# Patient Record
Sex: Female | Born: 1990 | Race: White | Hispanic: No | Smoking: Never smoker
Health system: Southern US, Community
[De-identification: ages and names within clinical notes are randomized; demographics above are authoritative.]

## PROBLEM LIST (undated history)

## (undated) DIAGNOSIS — Z8619 Personal history of other infectious and parasitic diseases: Secondary | ICD-10-CM

## (undated) HISTORY — PX: TONSILLECTOMY: SUR1361

## (undated) HISTORY — DX: Personal history of other infectious and parasitic diseases: Z86.19

## (undated) HISTORY — PX: OTHER SURGICAL HISTORY: SHX169

---
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
## 2013-12-03 DIAGNOSIS — M549 Dorsalgia, unspecified: Secondary | ICD-10-CM | POA: Insufficient documentation

## 2013-12-03 DIAGNOSIS — E663 Overweight: Secondary | ICD-10-CM | POA: Insufficient documentation

## 2015-12-27 DIAGNOSIS — M5384 Other specified dorsopathies, thoracic region: Secondary | ICD-10-CM | POA: Diagnosis not present

## 2015-12-27 DIAGNOSIS — M9902 Segmental and somatic dysfunction of thoracic region: Secondary | ICD-10-CM | POA: Diagnosis not present

## 2015-12-27 DIAGNOSIS — M9901 Segmental and somatic dysfunction of cervical region: Secondary | ICD-10-CM | POA: Diagnosis not present

## 2015-12-27 DIAGNOSIS — M62830 Muscle spasm of back: Secondary | ICD-10-CM | POA: Diagnosis not present

## 2015-12-27 DIAGNOSIS — M542 Cervicalgia: Secondary | ICD-10-CM | POA: Diagnosis not present

## 2015-12-31 DIAGNOSIS — M542 Cervicalgia: Secondary | ICD-10-CM | POA: Diagnosis not present

## 2015-12-31 DIAGNOSIS — M5384 Other specified dorsopathies, thoracic region: Secondary | ICD-10-CM | POA: Diagnosis not present

## 2015-12-31 DIAGNOSIS — M62830 Muscle spasm of back: Secondary | ICD-10-CM | POA: Diagnosis not present

## 2015-12-31 DIAGNOSIS — M9902 Segmental and somatic dysfunction of thoracic region: Secondary | ICD-10-CM | POA: Diagnosis not present

## 2015-12-31 DIAGNOSIS — M9901 Segmental and somatic dysfunction of cervical region: Secondary | ICD-10-CM | POA: Diagnosis not present

## 2016-01-03 DIAGNOSIS — M9901 Segmental and somatic dysfunction of cervical region: Secondary | ICD-10-CM | POA: Diagnosis not present

## 2016-01-03 DIAGNOSIS — M62830 Muscle spasm of back: Secondary | ICD-10-CM | POA: Diagnosis not present

## 2016-01-03 DIAGNOSIS — M9902 Segmental and somatic dysfunction of thoracic region: Secondary | ICD-10-CM | POA: Diagnosis not present

## 2016-01-03 DIAGNOSIS — M542 Cervicalgia: Secondary | ICD-10-CM | POA: Diagnosis not present

## 2016-01-03 DIAGNOSIS — M5384 Other specified dorsopathies, thoracic region: Secondary | ICD-10-CM | POA: Diagnosis not present

## 2016-01-08 DIAGNOSIS — M542 Cervicalgia: Secondary | ICD-10-CM | POA: Diagnosis not present

## 2016-01-08 DIAGNOSIS — M5384 Other specified dorsopathies, thoracic region: Secondary | ICD-10-CM | POA: Diagnosis not present

## 2016-01-08 DIAGNOSIS — M9901 Segmental and somatic dysfunction of cervical region: Secondary | ICD-10-CM | POA: Diagnosis not present

## 2016-01-08 DIAGNOSIS — M62830 Muscle spasm of back: Secondary | ICD-10-CM | POA: Diagnosis not present

## 2016-01-08 DIAGNOSIS — M9902 Segmental and somatic dysfunction of thoracic region: Secondary | ICD-10-CM | POA: Diagnosis not present

## 2016-01-15 DIAGNOSIS — M62830 Muscle spasm of back: Secondary | ICD-10-CM | POA: Diagnosis not present

## 2016-01-15 DIAGNOSIS — M5384 Other specified dorsopathies, thoracic region: Secondary | ICD-10-CM | POA: Diagnosis not present

## 2016-01-15 DIAGNOSIS — M9902 Segmental and somatic dysfunction of thoracic region: Secondary | ICD-10-CM | POA: Diagnosis not present

## 2016-01-15 DIAGNOSIS — M542 Cervicalgia: Secondary | ICD-10-CM | POA: Diagnosis not present

## 2016-01-15 DIAGNOSIS — M9901 Segmental and somatic dysfunction of cervical region: Secondary | ICD-10-CM | POA: Diagnosis not present

## 2016-05-09 ENCOUNTER — Encounter: Payer: Self-pay | Admitting: Advanced Practice Midwife

## 2016-05-09 ENCOUNTER — Ambulatory Visit (INDEPENDENT_AMBULATORY_CARE_PROVIDER_SITE_OTHER): Payer: 59 | Admitting: Advanced Practice Midwife

## 2016-05-09 VITALS — BP 118/81 | HR 88 | Ht 64.0 in | Wt 162.0 lb

## 2016-05-09 DIAGNOSIS — Z01419 Encounter for gynecological examination (general) (routine) without abnormal findings: Secondary | ICD-10-CM

## 2016-05-09 DIAGNOSIS — Z0000 Encounter for general adult medical examination without abnormal findings: Secondary | ICD-10-CM

## 2016-05-09 DIAGNOSIS — Z30011 Encounter for initial prescription of contraceptive pills: Secondary | ICD-10-CM | POA: Insufficient documentation

## 2016-05-09 MED ORDER — NORGESTIMATE-ETH ESTRADIOL 0.25-35 MG-MCG PO TABS: 1.0000 | ORAL_TABLET | Freq: Every day | ORAL | 13 refills | Status: DC

## 2016-05-09 NOTE — Patient Instructions (Signed)
Oral Contraception Use Oral contraceptive pills (OCPs) are medicines taken to prevent pregnancy. OCPs work by preventing the ovaries from releasing eggs. The hormones in OCPs also cause the cervical mucus to thicken, preventing the sperm from entering the uterus. The hormones also cause the uterine lining to become thin, not allowing a fertilized egg to attach to the inside of the uterus. OCPs are highly effective when taken exactly as prescribed. However, OCPs do not prevent sexually transmitted diseases (STDs). Safe sex practices, such as using condoms along with an OCP, can help prevent STDs. Before taking OCPs, you may have a physical exam and Pap test. Your health care provider may also order blood tests if necessary. Your health care provider will make sure you are a good candidate for oral contraception. Discuss with your health care provider the possible side effects of the OCP you may be prescribed. When starting an OCP, it can take 2 to 3 months for the body to adjust to the changes in hormone levels in your body. How to take oral contraceptive pills Your health care provider may advise you on how to start taking the first cycle of OCPs. Otherwise, you can:  Start on day 1 of your menstrual period. You will not need any backup contraceptive protection with this start time.  Start on the first Sunday after your menstrual period or the day you get your prescription. In these cases, you will need to use backup contraceptive protection for the first week.  Start the pill at any time of your cycle. If you take the pill within 5 days of the start of your period, you are protected against pregnancy right away. In this case, you will not need a backup form of birth control. If you start at any other time of your menstrual cycle, you will need to use another form of birth control for 7 days. If your OCP is the type called a minipill, it will protect you from pregnancy after taking it for 2 days (48  hours).  After you have started taking OCPs:  If you forget to take 1 pill, take it as soon as you remember. Take the next pill at the regular time.  If you miss 2 or more pills, call your health care provider because different pills have different instructions for missed doses. Use backup birth control until your next menstrual period starts.  If you use a 28-day pack that contains inactive pills and you miss 1 of the last 7 pills (pills with no hormones), it will not matter. Throw away the rest of the non-hormone pills and start a new pill pack.  No matter which day you start the OCP, you will always start a new pack on that same day of the week. Have an extra pack of OCPs and a backup contraceptive method available in case you miss some pills or lose your OCP pack. Follow these instructions at home:  Do not smoke.  Always use a condom to protect against STDs. OCPs do not protect against STDs.  Use a calendar to mark your menstrual period days.  Read the information and directions that came with your OCP. Talk to your health care provider if you have questions. Contact a health care provider if:  You develop nausea and vomiting.  You have abnormal vaginal discharge or bleeding.  You develop a rash.  You miss your menstrual period.  You are losing your hair.  You need treatment for mood swings or depression.  You   get dizzy when taking the OCP.  You develop acne from taking the OCP.  You become pregnant. Get help right away if:  You develop chest pain.  You develop shortness of breath.  You have an uncontrolled or severe headache.  You develop numbness or slurred speech.  You develop visual problems.  You develop pain, redness, and swelling in the legs. This information is not intended to replace advice given to you by your health care provider. Make sure you discuss any questions you have with your health care provider. Document Released: 02/27/2011 Document  Revised: 08/16/2015 Document Reviewed: 08/29/2012 Elsevier Interactive Patient Education  2017 Elsevier Inc.  

## 2016-05-09 NOTE — Progress Notes (Signed)
GYNECOLOGY ANNUAL PREVENTATIVE CARE ENCOUNTER NOTE  Subjective:   Rachel Reeves is a 26 y.o. G0P0000 female here for a routine annual gynecologic exam.  Current complaints: none.  Planning to get married in July.  Virginal.  Periods regular.  Denies abnormal vaginal bleeding, discharge, pelvic pain, problems with intercourse or other gynecologic concerns.    Gynecologic History Patient's last menstrual period was 05/05/2016. Contraception: none and but plans OCPs Last Pap: never.    Obstetric History OB History  Gravida Para Term Preterm AB Living  0 0 0 0 0 0  SAB TAB Ectopic Multiple Live Births  0 0 0 0 0        History reviewed. No pertinent past medical history.  Past Surgical History:  Procedure Laterality Date  . cataract surgery      No current outpatient prescriptions on file prior to visit.   No current facility-administered medications on file prior to visit.     Allergies  Allergen Reactions  . Monistat [Miconazole]     Social History   Social History  . Marital status: Single    Spouse name: N/A  . Number of children: N/A  . Years of education: N/A   Occupational History  . Not on file.   Social History Main Topics  . Smoking status: Never Smoker  . Smokeless tobacco: Never Used  . Alcohol use No  . Drug use: No  . Sexual activity: No   Other Topics Concern  . Not on file   Social History Narrative  . No narrative on file    Family History  Problem Relation Age of Onset  . Heart disease Paternal Grandmother   . Hypertension Paternal Grandmother   . Diabetes Paternal Grandmother     The following portions of the patient's history were reviewed and updated as appropriate: allergies, current medications, past family history, past medical history, past social history, past surgical history and problem list.  Review of Systems Pertinent items are noted in HPI.   Objective:  BP 118/81   Pulse 88   Ht 5\' 4"  (1.626 m)   Wt 162  lb (73.5 kg)   LMP 05/05/2016   BMI 27.81 kg/m  CONSTITUTIONAL: Well-developed, well-nourished female in no acute distress.  HENT:  Normocephalic, atraumatic, External right and left ear normal. Oropharynx is clear and moist EYES: Conjunctivae and EOM are normal. Pupils are equal, round, and reactive to light. No scleral icterus.  NECK: Normal range of motion, supple, no masses.  Normal thyroid.  SKIN: Skin is warm and dry. No rash noted. Not diaphoretic. No erythema. No pallor. NEUROLOGIC: Alert and oriented to person, place, and time. Normal reflexes, muscle tone coordination. No cranial nerve deficit noted. PSYCHIATRIC: Normal mood and affect. Normal behavior. Normal judgment and thought content. CARDIOVASCULAR: Normal heart rate noted, regular rhythm RESPIRATORY: Clear to auscultation bilaterally. Effort and breath sounds normal, no problems with respiration noted. BREASTS: Symmetric in size. No masses, skin changes, nipple drainage, or lymphadenopathy. ABDOMEN: Soft, normal bowel sounds, no distention noted.  No tenderness, rebound or guarding.  PELVIC: Normal appearing external genitalia; normal appearing vaginal mucosa and cervix.  No abnormal discharge noted.  Pap smear obtained.  Normal uterine size, no other palpable masses, no uterine or adnexal tenderness.  Tender on exam. Tight hymenal ring.  Cervix tiny, difficult to fully visualize due to discomfort. Pap done but unsure if TZ obtained. MUSCULOSKELETAL: Normal range of motion. No tenderness.  No cyanosis, clubbing, or edema.  2+  distal pulses.   Assessment:  Annual gynecologic examination with pap smear Contraception initiation   Plan:  Will follow up results of pap smear and manage accordingly. Rx Sprintec OCPs.  Discussed continuous use and risk of BTB in 3 month cycle.  Recommend take monthly at first then try a 634-month cycle.  Wants to try to time around honeymoon.  BP check in one month.  OK to do at work (works [employer]) Routine preventative health maintenance measures emphasized. Please refer to After Visit Summary for other counseling recommendations.

## 2016-05-12 LAB — CYTOLOGY - PAP: Diagnosis: NEGATIVE

## 2016-08-24 ENCOUNTER — Encounter: Payer: Self-pay | Admitting: Emergency Medicine

## 2016-08-24 ENCOUNTER — Ambulatory Visit (HOSPITAL_BASED_OUTPATIENT_CLINIC_OR_DEPARTMENT_OTHER)
Admission: RE | Admit: 2016-08-24 | Discharge: 2016-08-24 | Disposition: A | Discharge status: Home / Self Care | Payer: 59 | Source: Ambulatory Visit | Attending: Emergency Medicine | Admitting: Emergency Medicine

## 2016-08-24 ENCOUNTER — Emergency Department
Admission: EM | Admit: 2016-08-24 | Discharge: 2016-08-24 | Disposition: A | Discharge status: Home / Self Care | Payer: 59 | Source: Home / Self Care | Attending: Family Medicine | Admitting: Family Medicine

## 2016-08-24 DIAGNOSIS — M79662 Pain in left lower leg: Secondary | ICD-10-CM | POA: Insufficient documentation

## 2016-08-24 DIAGNOSIS — M7989 Other specified soft tissue disorders: Secondary | ICD-10-CM | POA: Diagnosis not present

## 2016-08-24 NOTE — ED Triage Notes (Signed)
Patient presents to [facility] with a complaint of Left Calf Pain x 2 days. Patient reports that she started OCP's approximately two months ago.

## 2016-08-24 NOTE — ED Provider Notes (Signed)
CSN: [id number]     Arrival date & time 08/24/16  1403 History   First MD Initiated Contact with Patient 08/24/16 1435     Chief Complaint  Patient presents with  . Leg Pain   (Consider location/radiation/quality/duration/timing/severity/associated sxs/prior Treatment) HPI  Rachel Reeves is a 26 y.o. female presenting to UC with c/o gradually worsening Left leg calf pain that started about 2-3 days ago.  Pain is aching and sore, 3/10.  Mild bruising. Denies known injury. She did start OCPs about 2 months ago. She does not smoke. No recent travel or surgery. No prior hx of blood clots but is concerned since she does not recall an injury.    History reviewed. No pertinent past medical history. Past Surgical History:  Procedure Laterality Date  . cataract surgery    . TONSILLECTOMY     Family History  Problem Relation Age of Onset  . Heart disease Paternal Grandmother   . Hypertension Paternal Grandmother   . Diabetes Paternal Grandmother    Social History  Substance Use Topics  . Smoking status: Never Smoker  . Smokeless tobacco: Never Used  . Alcohol use No   OB History    Gravida Para Term Preterm AB Living   0 0 0 0 0 0   SAB TAB Ectopic Multiple Live Births   0 0 0 0 0     Review of Systems  Constitutional: Negative for chills and fever.  Respiratory: Negative for chest tightness, shortness of breath and wheezing.   Cardiovascular: Negative for chest pain, palpitations and leg swelling.  Musculoskeletal: Positive for myalgias (Left calf). Negative for arthralgias and gait problem.  Skin: Positive for color change. Negative for wound.    Allergies  Monistat [miconazole]  Home Medications   Prior to Admission medications   Medication Sig Start Date End Date Taking? Authorizing Provider  norgestimate-ethinyl estradiol (ORTHO-CYCLEN,SPRINTEC,PREVIFEM) 0.25-35 MG-MCG tablet Take 1 tablet by mouth daily. Take one continuously as directed 05/09/16  Yes Aviva SignsWilliams,  Marie L, CNM   Meds Ordered and Administered this Visit  Medications - No data to display  BP (!) 142/83 (BP Location: Left Arm)   Pulse 97   Temp 98.4 F (36.9 C) (Oral)   Ht 5\' 4"  (1.626 m)   Wt 155 lb (70.3 kg)   SpO2 100%   BMI 26.61 kg/m  No data found.   Physical Exam  Constitutional: She is oriented to person, place, and time. She appears well-developed and well-nourished.  HENT:  Head: Normocephalic and atraumatic.  Eyes: EOM are normal.  Neck: Normal range of motion.  Cardiovascular: Normal rate.   Pulmonary/Chest: Effort normal.  Musculoskeletal: Normal range of motion. She exhibits tenderness. She exhibits no edema.  Left calf: soft, tenderness to mid posterior calf. Full ROM knee and ankle.  Neurological: She is alert and oriented to person, place, and time.  Skin: Skin is warm and dry.  Left calf: skin in tact. No erythema or warmth. Dime-sized area of faint yellowish ecchymosis. Tender.   Psychiatric: She has a normal mood and affect. Her behavior is normal.  Nursing note and vitals reviewed.   Urgent Care Course     Procedures (including critical care time)  Labs Review Labs Reviewed - No data to display  Imaging Review [place] Venous Img Lower Unilateral Left  Result Date: 08/24/2016 CLINICAL DATA:  Patient with left calf pain, worse with flexion. EXAM: LEFT LOWER EXTREMITY VENOUS DOPPLER ULTRASOUND TECHNIQUE: Gray-scale sonography with graded compression, as well  as color Doppler and duplex ultrasound were performed to evaluate the lower extremity deep venous systems from the level of the common femoral vein and including the common femoral, femoral, profunda femoral, popliteal and calf veins including the posterior tibial, peroneal and gastrocnemius veins when visible. The superficial great saphenous vein was also interrogated. Spectral Doppler was utilized to evaluate flow at rest and with distal augmentation maneuvers in the common femoral, femoral and  popliteal veins. COMPARISON:  None. FINDINGS: Contralateral Common Femoral Vein: Respiratory phasicity is normal and symmetric with the symptomatic side. No evidence of thrombus. Normal compressibility. Common Femoral Vein: No evidence of thrombus. Normal compressibility, respiratory phasicity and response to augmentation. Saphenofemoral Junction: No evidence of thrombus. Normal compressibility and flow on color Doppler imaging. Profunda Femoral Vein: No evidence of thrombus. Normal compressibility and flow on color Doppler imaging. Femoral Vein: No evidence of thrombus. Normal compressibility, respiratory phasicity and response to augmentation. Popliteal Vein: No evidence of thrombus. Normal compressibility, respiratory phasicity and response to augmentation. Calf Veins: No evidence of thrombus. Normal compressibility and flow on color Doppler imaging. Superficial Great Saphenous Vein: No evidence of thrombus. Normal compressibility and flow on color Doppler imaging. Venous Reflux:  None. Other Findings:  None. IMPRESSION: No evidence of DVT within the left lower extremity. Electronically Signed   By: Annia Belt M.D.   On: 08/24/2016 15:29     MDM   1. Pain of left calf    Pt low risk for DVT except for being on OCPs for 2 months. Calf pain w/o known cause.  Will send pt to [facility] for U/S to r/o DVT Pt agreeable with plan.    U/S: negative for DVT  Pain likely due to muscle strain. Encouraged alternating cool and warm compresses, acetaminophen and ibuprofen. F/u with PCP in 1 week if not improving.     Junius Finner, PA-C 08/24/16 1534

## 2017-01-07 DIAGNOSIS — Z961 Presence of intraocular lens: Secondary | ICD-10-CM | POA: Diagnosis not present

## 2017-01-07 DIAGNOSIS — H26492 Other secondary cataract, left eye: Secondary | ICD-10-CM | POA: Diagnosis not present

## 2017-01-07 DIAGNOSIS — H52203 Unspecified astigmatism, bilateral: Secondary | ICD-10-CM | POA: Diagnosis not present

## 2017-05-18 ENCOUNTER — Other Ambulatory Visit: Payer: Self-pay | Admitting: Advanced Practice Midwife

## 2017-05-18 MED ORDER — NORGESTIMATE-ETH ESTRADIOL 0.25-35 MG-MCG PO TABS: 1.0000 | ORAL_TABLET | Freq: Every day | ORAL | 13 refills | Status: DC

## 2017-05-18 NOTE — Progress Notes (Signed)
Refilled OCPs Doing well  Plans annual exam soon.  Aviva SignsWilliams, Marie L, CNM

## 2017-09-11 ENCOUNTER — Ambulatory Visit (INDEPENDENT_AMBULATORY_CARE_PROVIDER_SITE_OTHER): Payer: 59 | Admitting: Advanced Practice Midwife

## 2017-09-11 ENCOUNTER — Encounter: Payer: Self-pay | Admitting: Advanced Practice Midwife

## 2017-09-11 VITALS — BP 123/87 | HR 69 | Resp 16 | Ht 64.0 in | Wt 168.0 lb

## 2017-09-11 DIAGNOSIS — Z01419 Encounter for gynecological examination (general) (routine) without abnormal findings: Secondary | ICD-10-CM | POA: Diagnosis not present

## 2017-09-11 DIAGNOSIS — Z23 Encounter for immunization: Secondary | ICD-10-CM

## 2017-09-11 DIAGNOSIS — Z0000 Encounter for general adult medical examination without abnormal findings: Secondary | ICD-10-CM

## 2017-09-11 NOTE — Progress Notes (Signed)
Subjective:     Rachel LikeBethany M Reeves is a 27 y.o. female here for a routine exam.  Current complaints: none.  Was on OCPs but is currently not sexually active and does not want to continue. No complications with pills.  Participates in regular health maintenance.     Gynecologic History Patient's last menstrual period was 08/25/2017. Contraception: OCP (estrogen/progesterone) Last Pap: 2018. Results were: normal   Obstetric History OB History  Gravida Para Term Preterm AB Living  0 0 0 0 0 0  SAB TAB Ectopic Multiple Live Births  0 0 0 0 0     The following portions of the patient's history were reviewed and updated as appropriate: allergies, current medications, past family history, past medical history, past social history, past surgical history and problem list.  Review of Systems Pertinent items noted in HPI and remainder of comprehensive ROS otherwise negative.    Objective:   BP 123/87   Pulse 69   Resp 16   Ht 5\' 4"  (1.626 m)   Wt 168 lb (76.2 kg)   LMP 08/25/2017   BMI 28.84 kg/m    VS reviewed, nursing note reviewed,  Constitutional: well developed, well nourished, no distress HEENT: normocephalic CV: normal rate Pulm/chest wall: normal effort Breast Exam:  right breast normal without mass, skin or nipple changes or axillary nodes, left breast normal without mass, skin or nipple changes or axillary nodes Abdomen: soft Neuro: alert and oriented x 3 Skin: warm, dry Psych: affect normal Pelvic exam: Deferred Bimanual exam: Cervix 0/long/high, firm, anterior, neg CMT, uterus nontender, nonenlarged, adnexa without tenderness, enlargement, or mass  Assessment/Plan:   1. Well woman exam --Normal exam today, no complaints --Stop OCPs at end of pack as desired  2. Periodic health assessment, general screening, adult  - CBC - Lipid panel - Comprehensive metabolic panel - Vitamin D (25 hydroxy) - Tdap vaccine greater than or equal to 7yo IM  Sharen CounterLisa  Leftwich-Kirby, CNM 12:22 PM

## 2018-02-08 DIAGNOSIS — M25532 Pain in left wrist: Secondary | ICD-10-CM | POA: Diagnosis not present

## 2018-02-08 DIAGNOSIS — M25531 Pain in right wrist: Secondary | ICD-10-CM | POA: Insufficient documentation

## 2018-03-11 ENCOUNTER — Ambulatory Visit: Payer: 59 | Admitting: Family Medicine

## 2018-03-12 ENCOUNTER — Encounter: Payer: Self-pay | Admitting: Family Medicine

## 2018-03-12 ENCOUNTER — Ambulatory Visit: Payer: 59 | Admitting: Family Medicine

## 2018-03-12 VITALS — BP 118/76 | HR 83 | Temp 98.3°F | Ht 64.0 in | Wt 176.2 lb

## 2018-03-12 DIAGNOSIS — Z0000 Encounter for general adult medical examination without abnormal findings: Secondary | ICD-10-CM | POA: Diagnosis not present

## 2018-03-12 DIAGNOSIS — M6740 Ganglion, unspecified site: Secondary | ICD-10-CM

## 2018-03-12 DIAGNOSIS — Z114 Encounter for screening for human immunodeficiency virus [HIV]: Secondary | ICD-10-CM | POA: Diagnosis not present

## 2018-03-12 LAB — VITAMIN D 25 HYDROXY (VIT D DEFICIENCY, FRACTURES): VITD: 22.98 ng/mL — ABNORMAL LOW (ref 30.00–100.00)

## 2018-03-12 LAB — COMPREHENSIVE METABOLIC PANEL
ALT: 12 U/L (ref 0–35)
AST: 11 U/L (ref 0–37)
Albumin: 4.5 g/dL (ref 3.5–5.2)
Alkaline Phosphatase: 53 U/L (ref 39–117)
BUN: 7 mg/dL (ref 6–23)
CALCIUM: 9.6 mg/dL (ref 8.4–10.5)
CO2: 28 mEq/L (ref 19–32)
Chloride: 102 mEq/L (ref 96–112)
Creatinine, Ser: 0.75 mg/dL (ref 0.40–1.20)
GFR: 99.42 mL/min (ref >60.00)
Glucose, Bld: 98 mg/dL (ref 70–99)
Potassium: 3.6 mEq/L (ref 3.5–5.1)
Sodium: 138 mEq/L (ref 135–145)
Total Bilirubin: 0.4 mg/dL (ref 0.2–1.2)
Total Protein: 7.2 g/dL (ref 6.0–8.3)

## 2018-03-12 LAB — LIPID PANEL
Cholesterol: 154 mg/dL (ref 0–200)
HDL: 73.1 mg/dL (ref >39.00)
LDL Cholesterol: 67 mg/dL (ref 0–99)
NonHDL: 80.54
Total CHOL/HDL Ratio: 2
Triglycerides: 69 mg/dL (ref 0.0–149.0)
VLDL: 13.8 mg/dL (ref 0.0–40.0)

## 2018-03-12 LAB — CBC WITH DIFFERENTIAL/PLATELET
BASOS ABS: 0.1 10*3/uL (ref 0.0–0.1)
Basophils Relative: 0.5 % (ref 0.0–3.0)
Eosinophils Absolute: 0.1 10*3/uL (ref 0.0–0.7)
Eosinophils Relative: 0.5 % (ref 0.0–5.0)
HEMATOCRIT: 41.1 % (ref 36.0–46.0)
Hemoglobin: 13.6 g/dL (ref 12.0–15.0)
Lymphocytes Relative: 31.9 % (ref 12.0–46.0)
Lymphs Abs: 3.9 10*3/uL (ref 0.7–4.0)
MCHC: 33 g/dL (ref 30.0–36.0)
MCV: 94.9 fl (ref 78.0–100.0)
Monocytes Absolute: 1.3 10*3/uL — ABNORMAL HIGH (ref 0.1–1.0)
Monocytes Relative: 10.8 % (ref 3.0–12.0)
Neutro Abs: 7 10*3/uL (ref 1.4–7.7)
Neutrophils Relative %: 56.3 % (ref 43.0–77.0)
Platelets: 302 10*3/uL (ref 150.0–400.0)
RBC: 4.33 Mil/uL (ref 3.87–5.11)
RDW: 12.7 % (ref 11.5–15.5)
WBC: 12.4 10*3/uL — AB (ref 4.0–10.5)

## 2018-03-12 LAB — TSH: TSH: 1.47 u[IU]/mL (ref 0.35–4.50)

## 2018-03-12 NOTE — Progress Notes (Signed)
Patient: Rachel Reeves MRN: [id number] DOB: 10-04-1990 PCP: Orland MustardWolfe, Rachel Fuhrmann, MD     Subjective:  Chief Complaint  Patient presents with  . Establish Care  . Annual Exam    HPI: The patient is a 27 y.o. female who presents today for annual exam. She denies any changes to past medical history. There have been no recent hospitalizations. They are following a well balanced diet and exercise plan. Weight has been stable. No complaints today.   She has a ganglion cyst on her right wrist and is having surgery in January.   Fh of Htn in both of her parents.   Immunization History  Administered Date(s) Administered  . Tdap 09/11/2017    Pap smear: 04/2017. Normal  tdap utd   Review of Systems  Constitutional: Negative for chills, fatigue and fever.  HENT: Negative for dental problem, ear pain, hearing loss and trouble swallowing.   Eyes: Negative for visual disturbance.  Respiratory: Negative for cough, chest tightness and shortness of breath.   Cardiovascular: Negative for chest pain, palpitations and leg swelling.  Gastrointestinal: Negative for abdominal pain, blood in stool, diarrhea and nausea.  Endocrine: Negative for cold intolerance, polydipsia, polyphagia and polyuria.  Genitourinary: Negative for dysuria and hematuria.  Musculoskeletal: Negative for arthralgias.  Skin: Negative for rash.  Neurological: Negative for dizziness and headaches.  Psychiatric/Behavioral: Negative for dysphoric mood and sleep disturbance. The patient is not nervous/anxious.     Allergies Patient is allergic to monistat [miconazole].  Past Medical History Patient  has a past medical history of History of chicken pox.  Surgical History Patient  has a past surgical history that includes cataract surgery; Tonsillectomy; and Wisdom tooth extraction (2010).  Family History Pateint's family history includes COPD in her maternal grandmother; Cancer in her maternal grandmother; Diabetes in  her paternal grandmother; Heart disease in her paternal grandmother; Hypertension in her maternal grandmother and paternal grandmother; Kidney disease in her paternal grandmother.  Social History Patient  reports that she has never smoked. She has never used smokeless tobacco. She reports that she does not drink alcohol or use drugs.    Objective: Vitals:   03/12/18 0838  BP: 118/76  Pulse: 83  Temp: 98.3 F (36.8 C)  TempSrc: Oral  SpO2: 100%  Weight: 176 lb 3.2 oz (79.9 kg)  Height: 5\' 4"  (1.626 m)    Body mass index is 30.24 kg/m.  Physical Exam Vitals signs reviewed.  Constitutional:      Appearance: She is well-developed.  HENT:     Right Ear: External ear normal.     Left Ear: External ear normal.  Eyes:     Conjunctiva/sclera: Conjunctivae normal.     Pupils: Pupils are equal, round, and reactive to light.  Neck:     Musculoskeletal: Normal range of motion and neck supple.     Thyroid: No thyromegaly.  Cardiovascular:     Rate and Rhythm: Normal rate and regular rhythm.     Heart sounds: Normal heart sounds. No murmur.  Pulmonary:     Effort: Pulmonary effort is normal.     Breath sounds: Normal breath sounds.  Abdominal:     General: Bowel sounds are normal. There is no distension.     Palpations: Abdomen is soft.     Tenderness: There is no abdominal tenderness.  Lymphadenopathy:     Cervical: No cervical adenopathy.  Skin:    General: Skin is warm and dry.     Findings: No rash.  Neurological:  Mental Status: She is alert and oriented to person, place, and time.     Cranial Nerves: No cranial nerve deficit.     Coordination: Coordination normal.     Deep Tendon Reflexes: Reflexes normal.  Psychiatric:        Behavior: Behavior normal.    Depression screen PHQ 2/9 03/12/2018  Decreased Interest 0  Down, Depressed, Hopeless 0  PHQ - 2 Score 0       Assessment/plan: 1. Annual physical exam Continue exercise. Work on weight loss. Routine lab  work today. utd on immunizations. F/u in one year or as needed Patient counseling [x]    Nutrition: Stressed importance of moderation in sodium/caffeine intake, saturated fat and cholesterol, caloric balance, sufficient intake of fresh fruits, vegetables, fiber, calcium, iron, and 1 mg of folate supplement per day (for females capable of pregnancy).  [x]    Stressed the importance of regular exercise.   []    Substance Abuse: Discussed cessation/primary prevention of tobacco, alcohol, or other drug use; driving or other dangerous activities under the influence; availability of treatment for abuse.   [x]    Injury prevention: Discussed safety belts, safety helmets, smoke detector, smoking near bedding or upholstery.   [x]    Sexuality: Discussed sexually transmitted diseases, partner selection, use of condoms, avoidance of unintended pregnancy  and contraceptive alternatives.  [x]    Dental health: Discussed importance of regular tooth brushing, flossing, and dental visits.  [x]    Health maintenance and immunizations reviewed. Please refer to Health maintenance section.    - Comprehensive metabolic panel - CBC with Differential/Platelet - Lipid panel - TSH - VITAMIN D 25 Hydroxy (Vit-D Deficiency, Fractures)  2. Ganglion cyst Already has appointment, just needs referral placed.  - Ambulatory referral to Orthopedics  3. Encounter for screening for HIV - HIV Antibody (routine testing w rflx)    Return in about 1 year (around 03/13/2019).     Orland MustardAllison Kymberlyn Eckford, MD [facility]  03/12/2018

## 2018-03-13 LAB — HIV ANTIBODY (ROUTINE TESTING W REFLEX): HIV 1&2 Ab, 4th Generation: NONREACTIVE

## 2018-03-15 ENCOUNTER — Encounter: Payer: Self-pay | Admitting: Family Medicine

## 2018-04-04 ENCOUNTER — Encounter: Payer: Self-pay | Admitting: Family Medicine

## 2018-04-05 ENCOUNTER — Other Ambulatory Visit: Payer: Self-pay

## 2018-04-05 MED ORDER — VITAMIN D (ERGOCALCIFEROL) 1.25 MG (50000 UNIT) PO CAPS: 50000.0000 [IU] | ORAL_CAPSULE | ORAL | 0 refills | Status: DC

## 2018-04-05 MED FILL — VIT D2 1.25 MG (50,000 UNIT: 1.25 MG | 56 days supply | Qty: 8 | Fill #0

## 2018-04-15 DIAGNOSIS — M67439 Ganglion, unspecified wrist: Secondary | ICD-10-CM | POA: Insufficient documentation

## 2018-04-21 ENCOUNTER — Encounter: Payer: Self-pay | Admitting: Family Medicine

## 2018-07-18 IMAGING — US US EXTREM LOW VENOUS*L*
1 series · 13 of 24 positions shown · non-contrast
Comparison: None.

CLINICAL DATA: Patient with left calf pain, worse with flexion.



[Series 1: us extrem low venous*left* · 0.06mm/px · 13 of 37 slices shown]
[im 1/37]
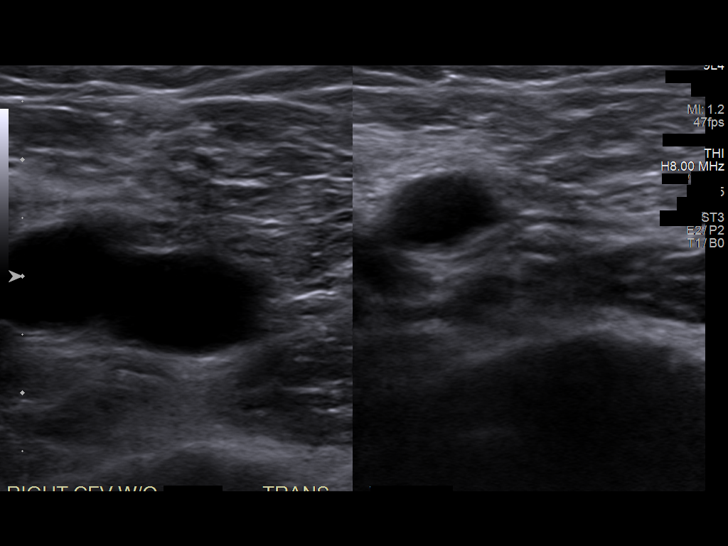
[im 4/37]
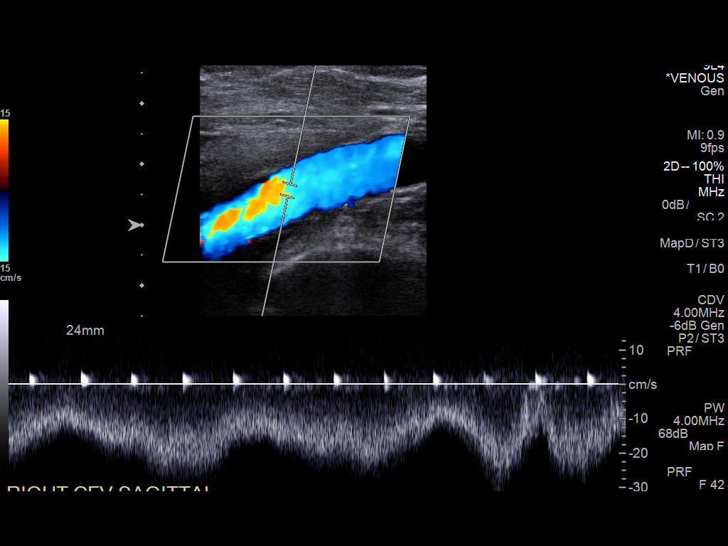
[im 7/37]
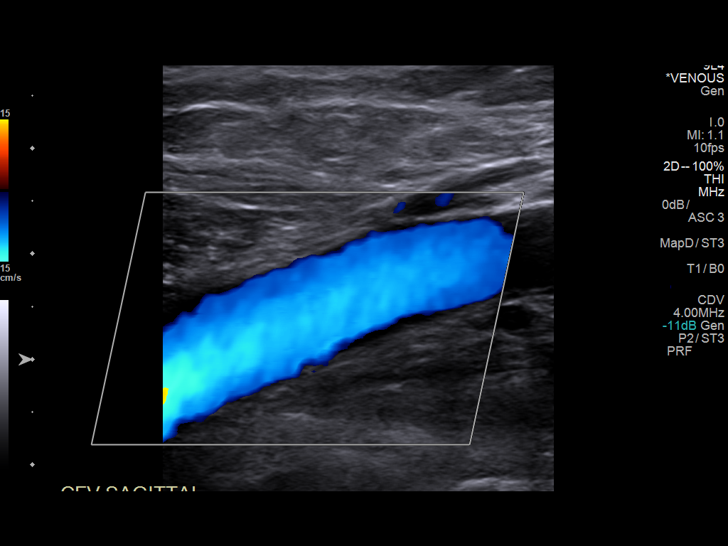
[im 10/37]
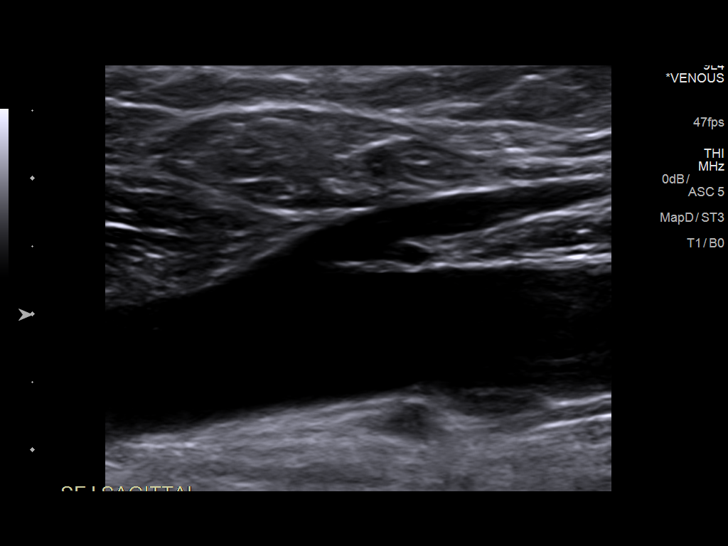
[im 13/37]
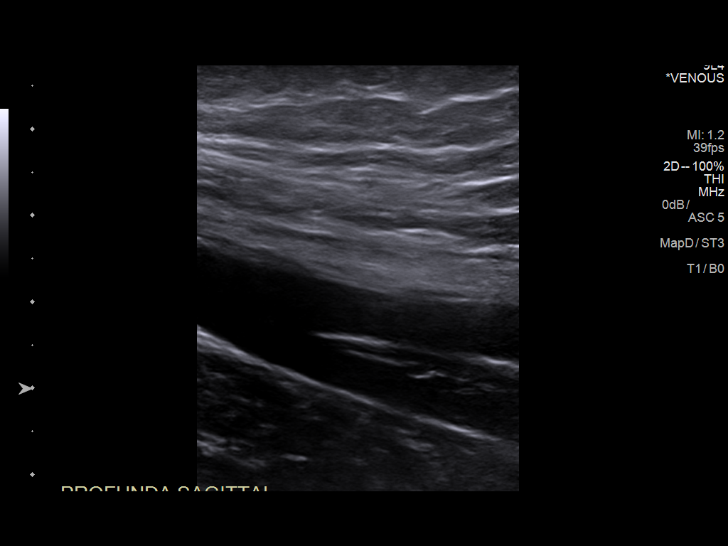
[im 16/37]
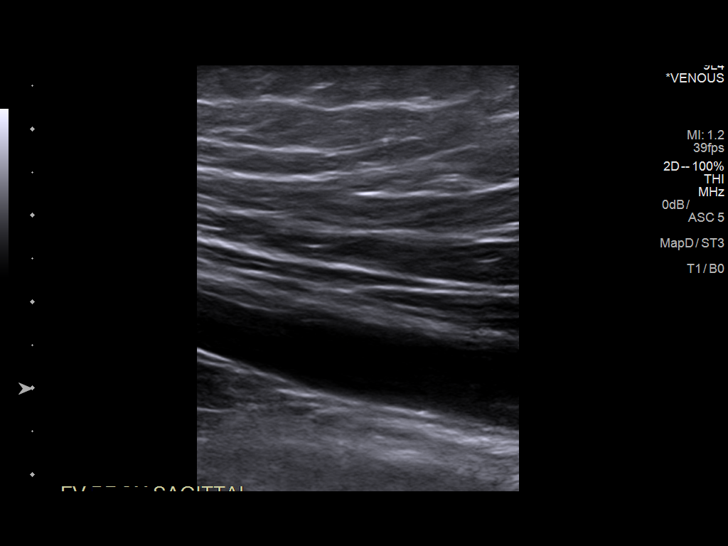
[im 19/37]
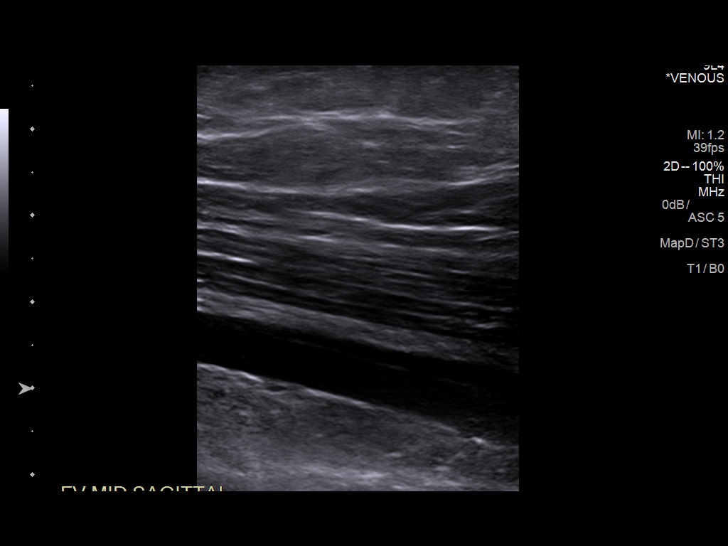
[im 21/37]
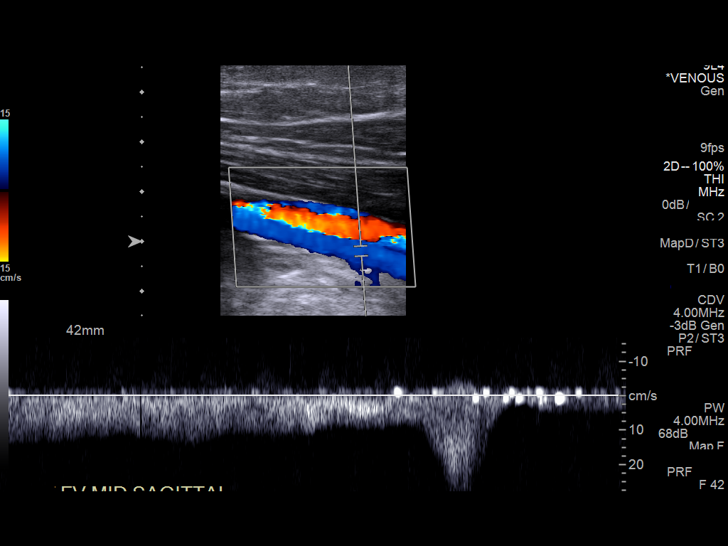
[im 24/37]
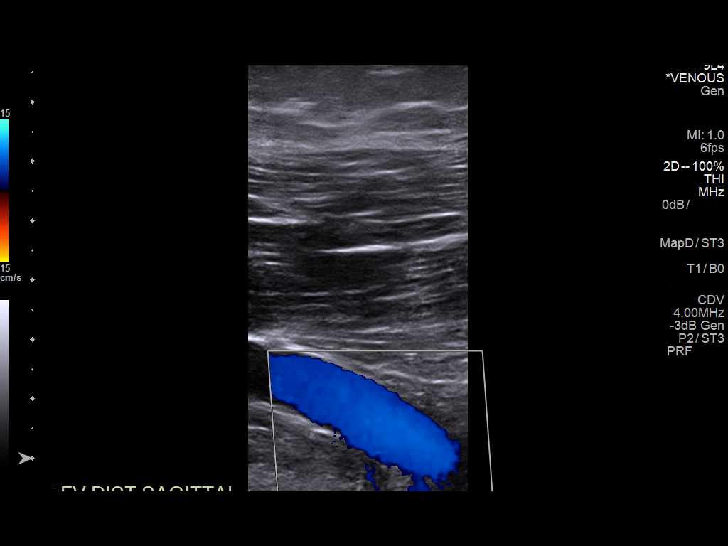
[im 27/37]
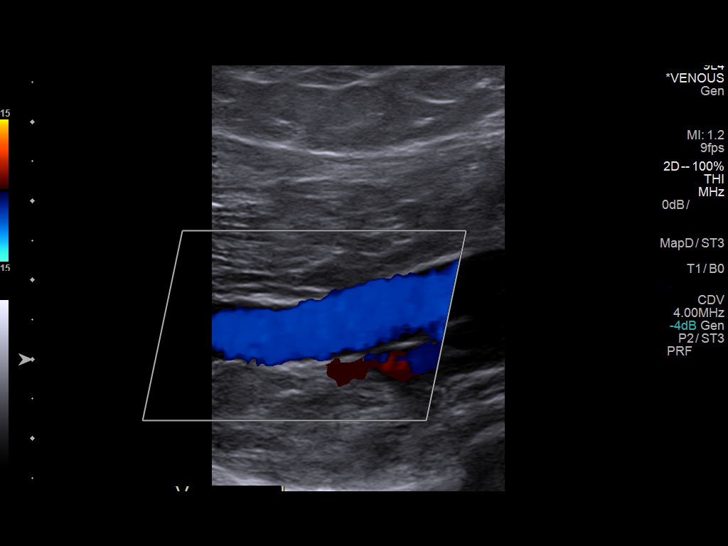
[im 30/37]
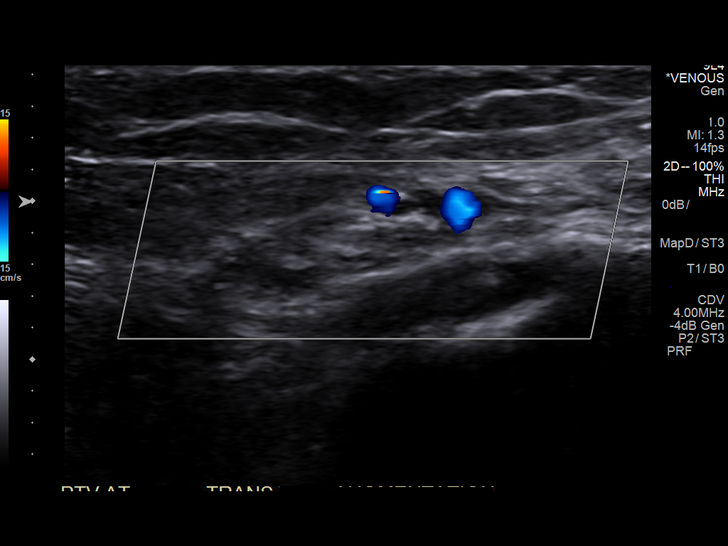
[im 33/37]
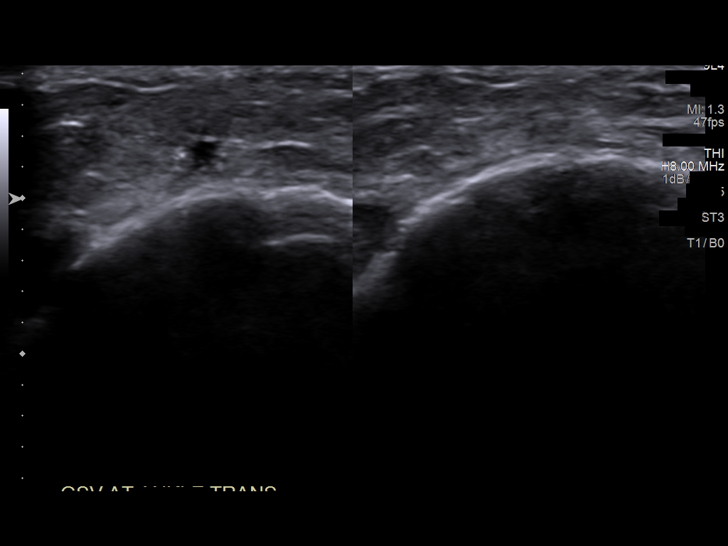
[im 37/37]
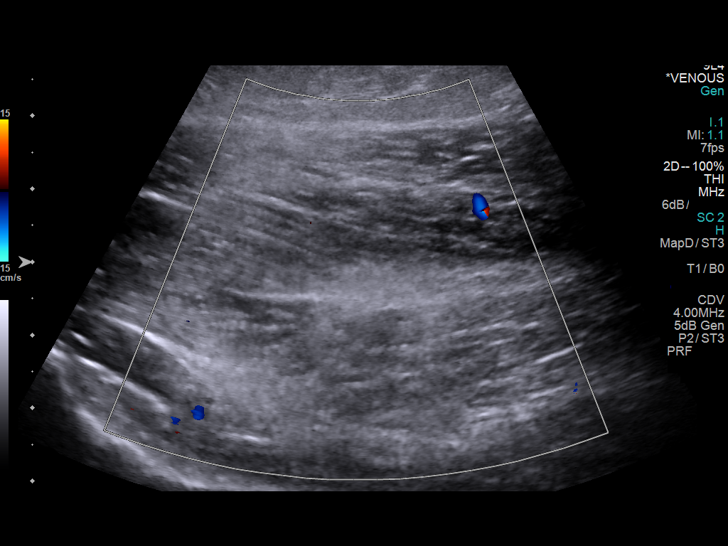

[13 of 24 positions shown; findings below may reference images not displayed]

FINDINGS: Contralateral Common Femoral Vein: Respiratory phasicity is normal
and symmetric with the symptomatic side. No evidence of thrombus.
Normal compressibility.

Common Femoral Vein: No evidence of thrombus. Normal
compressibility, respiratory phasicity and response to augmentation.

Saphenofemoral Junction: No evidence of thrombus. Normal
compressibility and flow on color Doppler imaging.

Profunda Femoral Vein: No evidence of thrombus. Normal
compressibility and flow on color Doppler imaging.

Femoral Vein: No evidence of thrombus. Normal compressibility,
respiratory phasicity and response to augmentation.

Popliteal Vein: No evidence of thrombus. Normal compressibility,
respiratory phasicity and response to augmentation.

Calf Veins: No evidence of thrombus. Normal compressibility and flow
on color Doppler imaging.

Superficial Great Saphenous Vein: No evidence of thrombus. Normal
compressibility and flow on color Doppler imaging.

Venous Reflux:  None.

Other Findings:  None.
IMPRESSION: No evidence of DVT within the left lower extremity.

## 2018-09-16 ENCOUNTER — Other Ambulatory Visit: Payer: Self-pay | Admitting: Advanced Practice Midwife

## 2018-09-16 MED ORDER — PANTOPRAZOLE SODIUM 20 MG PO TBEC: 20.0000 mg | DELAYED_RELEASE_TABLET | Freq: Every day | ORAL | 0 refills | Status: AC

## 2018-09-16 NOTE — Progress Notes (Signed)
Requesting medication for acid reflux. Rx Protonix

## 2018-09-17 MED FILL — PANTOPRAZOLE SOD DR 20 MG T: 20 | 30 days supply | Qty: 30 | Fill #0

## 2018-11-30 ENCOUNTER — Encounter: Payer: Self-pay | Admitting: Family Medicine

## 2018-12-01 ENCOUNTER — Other Ambulatory Visit: Payer: Self-pay

## 2018-12-01 DIAGNOSIS — M79672 Pain in left foot: Secondary | ICD-10-CM

## 2018-12-02 ENCOUNTER — Encounter: Payer: Self-pay | Admitting: Podiatry

## 2018-12-08 ENCOUNTER — Encounter: Payer: Self-pay | Admitting: Podiatry

## 2018-12-14 ENCOUNTER — Encounter: Payer: Self-pay | Admitting: Podiatry

## 2018-12-14 ENCOUNTER — Ambulatory Visit: Payer: No Typology Code available for payment source | Admitting: Podiatry

## 2018-12-14 ENCOUNTER — Other Ambulatory Visit: Payer: Self-pay

## 2018-12-14 ENCOUNTER — Ambulatory Visit (INDEPENDENT_AMBULATORY_CARE_PROVIDER_SITE_OTHER): Payer: No Typology Code available for payment source

## 2018-12-14 VITALS — BP 112/71 | HR 101 | Resp 16

## 2018-12-14 DIAGNOSIS — M722 Plantar fascial fibromatosis: Secondary | ICD-10-CM | POA: Diagnosis not present

## 2018-12-14 MED ORDER — MELOXICAM 15 MG PO TABS: 15.0000 mg | ORAL_TABLET | Freq: Every day | ORAL | 3 refills | Status: DC

## 2018-12-14 MED ORDER — METHYLPREDNISOLONE 4 MG PO TBPK: ORAL_TABLET | ORAL | 0 refills | Status: DC

## 2018-12-14 MED FILL — MELOXICAM 15 MG TABLET: 15 | 30 days supply | Qty: 30 | Fill #0

## 2018-12-14 MED FILL — METHYLPREDNISOLONE 4 MG TBP: 4 | 6 days supply | Qty: 21 | Fill #0

## 2018-12-14 NOTE — Progress Notes (Signed)
  Subjective:  Patient ID: Rachel Reeves, female    DOB: [birth year],  MRN: [id number] HPI Chief Complaint  Patient presents with  . Foot Pain    Left foot; arch, bottom of heel; & lateral side; pt stated, "I am on my feet all daya for work; my foot hurts more in the morning"; x2+ months    28 y.o. female presents with the above complaint.   ROS: Denies fever chills nausea vomiting muscle aches pains calf pain back pain chest pain shortness of breath.  Past Medical History:  Diagnosis Date  . History of chicken pox    Past Surgical History:  Procedure Laterality Date  . cataract surgery    . TONSILLECTOMY    . WISDOM TOOTH EXTRACTION  2010    Current Outpatient Medications:  .  pantoprazole (PROTONIX) 20 MG tablet, Take 1 tablet (20 mg total) by mouth daily., Disp: 30 tablet, Rfl: 0 .  VITAMIN D PO, Take 1,000 mg by mouth daily. , Disp: , Rfl:  .  meloxicam (MOBIC) 15 MG tablet, Take 1 tablet (15 mg total) by mouth daily., Disp: 30 tablet, Rfl: 3 .  methylPREDNISolone (MEDROL DOSEPAK) 4 MG TBPK tablet, 6 day dose pack - take as directed, Disp: 21 tablet, Rfl: 0  Allergies  Allergen Reactions  . Monistat [Miconazole]    Review of Systems Objective:   Vitals:   12/14/18 0904  BP: 112/71  Pulse: (!) 101  Resp: 16    General: Well developed, nourished, in no acute distress, alert and oriented x3   Dermatological: Skin is warm, dry and supple bilateral. Nails x 10 are well maintained; remaining integument appears unremarkable at this time. There are no open sores, no preulcerative lesions, no rash or signs of infection present.  Vascular: Dorsalis Pedis artery and Posterior Tibial artery pedal pulses are 2/4 bilateral with immedate capillary fill time. Pedal hair growth present. No varicosities and no lower extremity edema present bilateral.   Neruologic: Grossly intact via light touch bilateral. Vibratory intact via tuning fork bilateral. Protective threshold with  Semmes Wienstein monofilament intact to all pedal sites bilateral. Patellar and Achilles deep tendon reflexes 2+ bilateral. No Babinski or clonus noted bilateral.   Musculoskeletal: No gross boney pedal deformities bilateral. No pain, crepitus, or limitation noted with foot and ankle range of motion bilateral. Muscular strength 5/5 in all groups tested bilateral.  Pain on palpation medial Cokato tubercle of the left heel and arch.  Gait: Unassisted, Nonantalgic.    Radiographs:  Radiographs taken today demonstrate an osseously mature individual soft tissue increase in density plantar aspect insertion site of the left heel.  Minimal spurring is noted.  No other major osseous abnormalities no acute findings.  Assessment & Plan:   Assessment: Plantar fasciitis left.  Plan: Discussed etiology pathology conservative or surgical therapies at this point injected 20 mg Kenalog 5 mg Marcaine for muscle tenderness of her left heel.  Tolerated procedure well without complications.  Start her on a Medrol Dosepak to be followed by meloxicam.  Placed her in plantar fascial brace to be followed by a night splint.  Discussed appropriate shoe gear stretching exercises ice therapy and shoe gear modifications.     Rachel Reeves T. [place], [place]

## 2018-12-14 NOTE — Patient Instructions (Signed)

## 2019-01-18 ENCOUNTER — Other Ambulatory Visit: Payer: Self-pay

## 2019-01-18 ENCOUNTER — Ambulatory Visit (INDEPENDENT_AMBULATORY_CARE_PROVIDER_SITE_OTHER): Payer: No Typology Code available for payment source | Admitting: Podiatry

## 2019-01-18 DIAGNOSIS — M722 Plantar fascial fibromatosis: Secondary | ICD-10-CM | POA: Diagnosis not present

## 2019-01-19 NOTE — Progress Notes (Signed)
She presents today for follow-up of her plantar fasciitis of her left foot.  She states the injections seem to help a lot she states that it has not improved tremendously and she still taking the meloxicam and utilizing her braces and night splint.  She goes on to relate that she is 99.9% improved.  Objective: Vital signs are stable she is alert and oriented x3.  Pulses remain strong and palpable.  She has no reproducible pain on palpation medial calcaneal tubercle of the left heel.  Assessment: Resolving plantar fasciitis left.  Plan: Instructed the patient to continue all conservative therapies including the meloxicam plantar fascial brace and night splint as well as appropriate shoe gear for at least another month.  Should she have any recurrence she will notify us immediately otherwise she will discontinue all treatments in 1 month did discuss continued wear of appropriate shoe gear.

## 2019-11-10 ENCOUNTER — Encounter: Payer: Self-pay | Admitting: Family Medicine

## 2019-11-11 ENCOUNTER — Other Ambulatory Visit: Payer: Self-pay

## 2019-11-11 DIAGNOSIS — M6740 Ganglion, unspecified site: Secondary | ICD-10-CM

## 2019-11-15 ENCOUNTER — Encounter: Payer: Self-pay | Admitting: Advanced Practice Midwife

## 2019-11-15 ENCOUNTER — Other Ambulatory Visit (HOSPITAL_COMMUNITY)
Admission: RE | Admit: 2019-11-15 | Discharge: 2019-11-15 | Disposition: A | Discharge status: Home / Self Care | Payer: No Typology Code available for payment source | Source: Ambulatory Visit | Attending: Advanced Practice Midwife | Admitting: Advanced Practice Midwife

## 2019-11-15 ENCOUNTER — Ambulatory Visit (INDEPENDENT_AMBULATORY_CARE_PROVIDER_SITE_OTHER): Payer: No Typology Code available for payment source | Admitting: Advanced Practice Midwife

## 2019-11-15 VITALS — BP 111/66 | HR 75 | Ht 64.0 in | Wt 183.0 lb

## 2019-11-15 DIAGNOSIS — Z01419 Encounter for gynecological examination (general) (routine) without abnormal findings: Secondary | ICD-10-CM | POA: Diagnosis not present

## 2019-11-15 DIAGNOSIS — E559 Vitamin D deficiency, unspecified: Secondary | ICD-10-CM

## 2019-11-15 DIAGNOSIS — Z8342 Family history of familial hypercholesterolemia: Secondary | ICD-10-CM | POA: Diagnosis not present

## 2019-11-15 DIAGNOSIS — R5383 Other fatigue: Secondary | ICD-10-CM

## 2019-11-15 NOTE — Patient Instructions (Signed)
Health Maintenance, Female Adopting a healthy lifestyle and getting preventive care are important in promoting health and wellness. Ask your health care provider about:  The right schedule for you to have regular tests and exams.  Things you can do on your own to prevent diseases and keep yourself healthy. What should I know about diet, weight, and exercise? Eat a healthy diet   Eat a diet that includes plenty of vegetables, fruits, low-fat dairy products, and lean protein.  Do not eat a lot of foods that are high in solid fats, added sugars, or sodium. Maintain a healthy weight Body mass index (BMI) is used to identify weight problems. It estimates body fat based on height and weight. Your health care provider can help determine your BMI and help you achieve or maintain a healthy weight. Get regular exercise Get regular exercise. This is one of the most important things you can do for your health. Most adults should:  Exercise for at least 150 minutes each week. The exercise should increase your heart rate and make you sweat (moderate-intensity exercise).  Do strengthening exercises at least twice a week. This is in addition to the moderate-intensity exercise.  Spend less time sitting. Even light physical activity can be beneficial. Watch cholesterol and blood lipids Have your blood tested for lipids and cholesterol at 27 years of age, then have this test every 5 years. Have your cholesterol levels checked more often if:  Your lipid or cholesterol levels are high.  You are older than 27 years of age.  You are at high risk for heart disease. What should I know about cancer screening? Depending on your health history and family history, you may need to have cancer screening at various ages. This may include screening for:  Breast cancer.  Cervical cancer.  Colorectal cancer.  Skin cancer.  Lung cancer. What should I know about heart disease, diabetes, and high blood  pressure? Blood pressure and heart disease  High blood pressure causes heart disease and increases the risk of stroke. This is more likely to develop in people who have high blood pressure readings, are of African descent, or are overweight.  Have your blood pressure checked: ? Every 3-5 years if you are 18-39 years of age. ? Every year if you are 40 years old or older. Diabetes Have regular diabetes screenings. This checks your fasting blood sugar level. Have the screening done:  Once every three years after age 40 if you are at a normal weight and have a low risk for diabetes.  More often and at a younger age if you are overweight or have a high risk for diabetes. What should I know about preventing infection? Hepatitis B If you have a higher risk for hepatitis B, you should be screened for this virus. Talk with your health care provider to find out if you are at risk for hepatitis B infection. Hepatitis C Testing is recommended for:  Everyone born from 1945 through 1965.  Anyone with known risk factors for hepatitis C. Sexually transmitted infections (STIs)  Get screened for STIs, including gonorrhea and chlamydia, if: ? You are sexually active and are younger than 27 years of age. ? You are older than 27 years of age and your health care provider tells you that you are at risk for this type of infection. ? Your sexual activity has changed since you were last screened, and you are at increased risk for chlamydia or gonorrhea. Ask your health care provider if   you are at risk.  Ask your health care provider about whether you are at high risk for HIV. Your health care provider may recommend a prescription medicine to help prevent HIV infection. If you choose to take medicine to prevent HIV, you should first get tested for HIV. You should then be tested every 3 months for as long as you are taking the medicine. Pregnancy  If you are about to stop having your period (premenopausal) and  you may become pregnant, seek counseling before you get pregnant.  Take 400 to 800 micrograms (mcg) of folic acid every day if you become pregnant.  Ask for birth control (contraception) if you want to prevent pregnancy. Osteoporosis and menopause Osteoporosis is a disease in which the bones lose minerals and strength with aging. This can result in bone fractures. If you are 65 years old or older, or if you are at risk for osteoporosis and fractures, ask your health care provider if you should:  Be screened for bone loss.  Take a calcium or vitamin D supplement to lower your risk of fractures.  Be given hormone replacement therapy (HRT) to treat symptoms of menopause. Follow these instructions at home: Lifestyle  Do not use any products that contain nicotine or tobacco, such as cigarettes, e-cigarettes, and chewing tobacco. If you need help quitting, ask your health care provider.  Do not use street drugs.  Do not share needles.  Ask your health care provider for help if you need support or information about quitting drugs. Alcohol use  Do not drink alcohol if: ? Your health care provider tells you not to drink. ? You are pregnant, may be pregnant, or are planning to become pregnant.  If you drink alcohol: ? Limit how much you use to 0-1 drink a day. ? Limit intake if you are breastfeeding.  Be aware of how much alcohol is in your drink. In the [place] one drink equals one 12 oz bottle of beer (355 mL), one 5 oz glass of wine (148 mL), or one 1 oz glass of hard liquor (44 mL). General instructions  Schedule regular health, dental, and eye exams.  Stay current with your vaccines.  Tell your health care provider if: ? You often feel depressed. ? You have ever been abused or do not feel safe at home. Summary  Adopting a healthy lifestyle and getting preventive care are important in promoting health and wellness.  Follow your health care provider's instructions about healthy  diet, exercising, and getting tested or screened for diseases.  Follow your health care provider's instructions on monitoring your cholesterol and blood pressure. This information is not intended to replace advice given to you by your health care provider. Make sure you discuss any questions you have with your health care provider. Document Revised: 03/03/2018 Document Reviewed: 03/03/2018 Elsevier Patient Education  2020 [employer]  

## 2019-11-15 NOTE — Progress Notes (Signed)
GYNECOLOGY ANNUAL PREVENTATIVE CARE ENCOUNTER NOTE  Subjective:   Rachel Reeves is a 29 y.o. G0P0000 female here for a routine annual gynecologic exam.  Current complaints: fatigue (works nights), stress.  Has a history of Vitamin D insufficiency and familial hypercholesterolemia  Stopped OCPs last year.  Periods very regular, last 5 days.   No menorrhagia. Minimal dysmenorrhea.  Denies abnormal vaginal bleeding, discharge, pelvic pain, or other gynecologic concerns.  Not sexually active, divorced  Followed by Optho yearly, S/P Congenital cataract surgeries   Gynecologic History Patient's last menstrual period was 10/23/2019. Contraception: abstinence Last Pap: 2018. Results were: normal Last mammogram: never  Obstetric History OB History  Gravida Para Term Preterm AB Living  0 0 0 0 0 0  SAB TAB Ectopic Multiple Live Births  0 0 0 0 0    Past Medical History:  Diagnosis Date  . History of chicken pox     Past Surgical History:  Procedure Laterality Date  . cataract surgery    . TONSILLECTOMY    . WISDOM TOOTH EXTRACTION  2010    Current Outpatient Medications on File Prior to Visit  Medication Sig Dispense Refill  . pantoprazole (PROTONIX) 20 MG tablet Take 1 tablet (20 mg total) by mouth daily. 30 tablet 0  . VITAMIN D PO Take 1,000 mg by mouth daily.     . meloxicam (MOBIC) 15 MG tablet Take 1 tablet (15 mg total) by mouth daily. 30 tablet 3  . methylPREDNISolone (MEDROL DOSEPAK) 4 MG TBPK tablet 6 day dose pack - take as directed 21 tablet 0   No current facility-administered medications on file prior to visit.    Allergies  Allergen Reactions  . Monistat [Miconazole]     Social History   Socioeconomic History  . Marital status: Divorced    Spouse name: Not on file  . Number of children: 0  . Years of education: Not on file  . Highest education level: Bachelor's degree (e.g., BA, AB, BS)  Occupational History  . Occupation: Teacher, adult education: [employer]     Comment: Labor and Delivery  Tobacco Use  . Smoking status: Never Smoker  . Smokeless tobacco: Never Used  Vaping Use  . Vaping Use: Never used  Substance and Sexual Activity  . Alcohol use: No  . Drug use: No  . Sexual activity: Not Currently    Birth control/protection: None  Other Topics Concern  . Not on file  Social History Narrative  . Not on file   Social Determinants of Health   Financial Resource Strain:   . Difficulty of Paying Living Expenses: Not on file  Food Insecurity:   . Worried About Programme researcher, broadcasting/film/video in the Last Year: Not on file  . Ran Out of Food in the Last Year: Not on file  Transportation Needs:   . Lack of Transportation (Medical): Not on file  . Lack of Transportation (Non-Medical): Not on file  Physical Activity:   . Days of Exercise per Week: Not on file  . Minutes of Exercise per Session: Not on file  Stress:   . Feeling of Stress : Not on file  Social Connections:   . Frequency of Communication with Friends and Family: Not on file  . Frequency of Social Gatherings with Friends and Family: Not on file  . Attends Religious Services: Not on file  . Active Member of Clubs or Organizations: Not on file  . Attends Banker Meetings:  Not on file  . Marital Status: Not on file  Intimate Partner Violence:   . Fear of Current or Ex-Partner: Not on file  . Emotionally Abused: Not on file  . Physically Abused: Not on file  . Sexually Abused: Not on file    Family History  Problem Relation Age of Onset  . Heart disease Paternal Grandmother   . Hypertension Paternal Grandmother   . Diabetes Paternal Grandmother   . Kidney disease Paternal Grandmother   . Cancer Maternal Grandmother   . COPD Maternal Grandmother   . Hypertension Maternal Grandmother     The following portions of the patient's history were reviewed and updated as appropriate: allergies, current medications, past family history, past medical history, past social  history, past surgical history and problem list.  Review of Systems Constitutional: negative Respiratory: negative Cardiovascular: negative Gastrointestinal: negative, uses occasional protonix Genitourinary:negative, intermittent SBE, no masses Integument/breast: negative, Does SBE sometimes, no masses Musculoskeletal:negative   Objective:  BP 111/66   Pulse 75   Ht 5\' 4"  (1.626 m)   Wt 183 lb (83 kg)   LMP 10/23/2019   BMI 31.41 kg/m  CONSTITUTIONAL: Well-developed, well-nourished female in no acute distress.  HENT:  Normocephalic, atraumatic, External right and left ear normal. Oropharynx is clear and moist EYES: Conjunctivae and EOM are normal. Pupils are equal, round, and reactive to light. No scleral icterus. Followed by Optho yearly NECK: Normal range of motion, supple, no masses.  Normal thyroid.  SKIN: Skin is warm and dry. No rash noted. Not diaphoretic. No erythema. No pallor. NEUROLOGIC: Alert and oriented to person, place, and time. Normal reflexes, muscle tone coordination. No cranial nerve deficit noted. PSYCHIATRIC: Normal mood and affect. Normal behavior. Normal judgment and thought content. CARDIOVASCULAR: Normal heart rate noted, regular rhythm RESPIRATORY: Clear to auscultation bilaterally. Effort and breath sounds normal, no problems with respiration noted. BREASTS: Symmetric in size. No masses, skin changes, nipple drainage, or lymphadenopathy. ABDOMEN: Soft, normal bowel sounds, no distention noted.  No tenderness, rebound or guarding.  PELVIC: Normal appearing external genitalia; normal appearing vaginal mucosa and cervix.  No abnormal discharge noted.  Pap smear obtained.  Normal uterine size, no other palpable masses, no uterine or adnexal tenderness. MUSCULOSKELETAL: Normal range of motion. No tenderness.  No cyanosis, clubbing, or edema.  2+ distal pulses.  The patient was counseled on the potential benefits and lack of known risks of COVID vaccination,  during pregnancy and breastfeeding, on today's visit. The patient's questions and concerns were addressed today.   The patient is not planning to get vaccinated at this time. The patient is aware that if she chooses not to get an employee mandated vaccination we will provide documentation for her employers in the form of a letter unless a specific exemption form is submitted to the provider. The patient is aware that this documentation is a deferment of a mandated vaccination that will need to be renewed by a provider periodically.    Assessment:  Annual gynecologic examination with pap smear Fatigue Vitamin D Insufficiency Family History of hypercholesterolemia   Plan:  Will follow up results of pap smear and manage accordingly. Check CBC and TSH today  Lipid panel (pt is fasting) Recommend daily Vit D supplements, dose dependent on result. Routine preventative health maintenance measures emphasized. Declines contraception at this time Please refer to After Visit Summary for other counseling recommendations.

## 2019-11-16 ENCOUNTER — Other Ambulatory Visit: Payer: Self-pay | Admitting: Advanced Practice Midwife

## 2019-11-16 DIAGNOSIS — E559 Vitamin D deficiency, unspecified: Secondary | ICD-10-CM

## 2019-11-16 LAB — CBC
Hematocrit: 40.4 % (ref 34.0–46.6)
Hemoglobin: 13.5 g/dL (ref 11.1–15.9)
MCH: 31 pg (ref 26.6–33.0)
MCHC: 33.4 g/dL (ref 31.5–35.7)
MCV: 93 fL (ref 79–97)
Platelets: 320 10*3/uL (ref 150–450)
RBC: 4.36 x10E6/uL (ref 3.77–5.28)
RDW: 12.6 % (ref 11.7–15.4)
WBC: 13.1 10*3/uL — ABNORMAL HIGH (ref 3.4–10.8)

## 2019-11-16 LAB — LIPID PANEL
Chol/HDL Ratio: 2.3 ratio (ref 0.0–4.4)
Cholesterol, Total: 160 mg/dL (ref 100–199)
HDL: 69 mg/dL (ref >39)
LDL Chol Calc (NIH): 78 mg/dL (ref 0–99)
Triglycerides: 68 mg/dL (ref 0–149)
VLDL Cholesterol Cal: 13 mg/dL (ref 5–40)

## 2019-11-16 LAB — CYTOLOGY - PAP
Adequacy: ABSENT
Diagnosis: NEGATIVE

## 2019-11-16 LAB — VITAMIN D 25 HYDROXY (VIT D DEFICIENCY, FRACTURES): Vit D, 25-Hydroxy: 17.3 ng/mL — ABNORMAL LOW (ref 30.0–100.0)

## 2019-11-16 LAB — TSH: TSH: 2.43 u[IU]/mL (ref 0.450–4.500)

## 2019-11-16 MED ORDER — VITAMIN D3 1.25 MG (50000 UT) PO TABS: 1.0000 | ORAL_TABLET | ORAL | 0 refills | Status: AC

## 2019-11-16 MED FILL — VIT D3-50 50,000 UNITS CAPS: 1.25 MG | 42 days supply | Qty: 6 | Fill #0

## 2019-11-16 NOTE — Progress Notes (Signed)
Vitamin D Insufficiency noted Will Rx Vit D 50,000 U weekly x 6  Then 1000-2000 per day

## 2019-12-08 MED FILL — oxyCODONE HCL 5 MG TABS: 5 | 7 days supply | Qty: 42 | Fill #0

## 2020-03-26 ENCOUNTER — Encounter: Payer: Self-pay | Admitting: Family Medicine

## 2022-08-19 ENCOUNTER — Other Ambulatory Visit (HOSPITAL_BASED_OUTPATIENT_CLINIC_OR_DEPARTMENT_OTHER): Payer: Self-pay

## 2022-08-19 MED ORDER — KETOCONAZOLE 2 % EX SHAM
1.0000 | MEDICATED_SHAMPOO | CUTANEOUS | 11 refills | Status: AC
  Filled 2022-08-19: qty 120, 30d supply, fill #0
  Filled 2022-09-14: qty 120, 30d supply, fill #1

## 2022-08-19 MED ORDER — DOXYCYCLINE HYCLATE 100 MG PO CAPS
100.0000 mg | ORAL_CAPSULE | Freq: Two times a day (BID) | ORAL | 0 refills | Status: DC
  Filled 2022-08-19: qty 60, 30d supply, fill #0

## 2022-08-19 MED ORDER — HYDROCORTISONE 2.5 % EX CREA
1.0000 | TOPICAL_CREAM | Freq: Two times a day (BID) | CUTANEOUS | 3 refills | Status: AC
  Filled 2022-08-19: qty 30, 15d supply, fill #0

## 2022-09-15 ENCOUNTER — Other Ambulatory Visit: Payer: Self-pay

## 2022-09-18 ENCOUNTER — Other Ambulatory Visit: Payer: Self-pay

## 2022-10-23 ENCOUNTER — Other Ambulatory Visit (HOSPITAL_BASED_OUTPATIENT_CLINIC_OR_DEPARTMENT_OTHER): Payer: Self-pay

## 2022-10-23 MED ORDER — CLINDAMYCIN PHOSPHATE 1 % EX LOTN
1.0000 | TOPICAL_LOTION | Freq: Every day | CUTANEOUS | 2 refills | Status: AC
  Filled 2022-10-23: qty 60, 30d supply, fill #0

## 2022-10-23 MED ORDER — DOXYCYCLINE HYCLATE 100 MG PO CAPS
100.0000 mg | ORAL_CAPSULE | Freq: Two times a day (BID) | ORAL | 0 refills | Status: AC
  Filled 2022-10-23: qty 60, 30d supply, fill #0

## 2022-10-23 MED ORDER — SULFACETAMIDE SODIUM-SULFUR 10-5 % EX LIQD
1.0000 | Freq: Every day | CUTANEOUS | 6 refills | Status: AC
  Filled 2022-10-23: qty 340.2, 30d supply, fill #0

## 2022-10-24 ENCOUNTER — Other Ambulatory Visit (HOSPITAL_BASED_OUTPATIENT_CLINIC_OR_DEPARTMENT_OTHER): Payer: Self-pay

## 2022-11-03 ENCOUNTER — Other Ambulatory Visit (HOSPITAL_BASED_OUTPATIENT_CLINIC_OR_DEPARTMENT_OTHER): Payer: Self-pay

## 2022-11-04 ENCOUNTER — Other Ambulatory Visit (HOSPITAL_BASED_OUTPATIENT_CLINIC_OR_DEPARTMENT_OTHER): Payer: Self-pay

## 2022-11-06 ENCOUNTER — Other Ambulatory Visit (HOSPITAL_BASED_OUTPATIENT_CLINIC_OR_DEPARTMENT_OTHER): Payer: Self-pay

## 2022-11-10 ENCOUNTER — Other Ambulatory Visit (HOSPITAL_BASED_OUTPATIENT_CLINIC_OR_DEPARTMENT_OTHER): Payer: Self-pay

## 2022-11-12 ENCOUNTER — Other Ambulatory Visit (HOSPITAL_BASED_OUTPATIENT_CLINIC_OR_DEPARTMENT_OTHER): Payer: Self-pay

## 2022-11-13 ENCOUNTER — Other Ambulatory Visit (HOSPITAL_BASED_OUTPATIENT_CLINIC_OR_DEPARTMENT_OTHER): Payer: Self-pay

## 2022-11-14 ENCOUNTER — Other Ambulatory Visit (HOSPITAL_BASED_OUTPATIENT_CLINIC_OR_DEPARTMENT_OTHER): Payer: Self-pay

## 2022-11-17 ENCOUNTER — Other Ambulatory Visit (HOSPITAL_BASED_OUTPATIENT_CLINIC_OR_DEPARTMENT_OTHER): Payer: Self-pay

## 2022-11-18 ENCOUNTER — Other Ambulatory Visit (HOSPITAL_BASED_OUTPATIENT_CLINIC_OR_DEPARTMENT_OTHER): Payer: Self-pay

## 2022-11-25 ENCOUNTER — Other Ambulatory Visit (HOSPITAL_BASED_OUTPATIENT_CLINIC_OR_DEPARTMENT_OTHER): Payer: Self-pay

## 2022-11-26 ENCOUNTER — Other Ambulatory Visit (HOSPITAL_BASED_OUTPATIENT_CLINIC_OR_DEPARTMENT_OTHER): Payer: Self-pay

## 2022-12-02 ENCOUNTER — Other Ambulatory Visit (HOSPITAL_BASED_OUTPATIENT_CLINIC_OR_DEPARTMENT_OTHER): Payer: Self-pay

## 2022-12-03 ENCOUNTER — Other Ambulatory Visit (HOSPITAL_BASED_OUTPATIENT_CLINIC_OR_DEPARTMENT_OTHER): Payer: Self-pay

## 2022-12-04 ENCOUNTER — Other Ambulatory Visit (HOSPITAL_BASED_OUTPATIENT_CLINIC_OR_DEPARTMENT_OTHER): Payer: Self-pay

## 2022-12-08 ENCOUNTER — Other Ambulatory Visit (HOSPITAL_BASED_OUTPATIENT_CLINIC_OR_DEPARTMENT_OTHER): Payer: Self-pay

## 2022-12-09 ENCOUNTER — Other Ambulatory Visit (HOSPITAL_BASED_OUTPATIENT_CLINIC_OR_DEPARTMENT_OTHER): Payer: Self-pay

## 2023-01-26 ENCOUNTER — Other Ambulatory Visit (HOSPITAL_BASED_OUTPATIENT_CLINIC_OR_DEPARTMENT_OTHER): Payer: Self-pay

## 2023-01-26 MED ORDER — CICLOPIROX OLAMINE 0.77 % EX CREA
TOPICAL_CREAM | CUTANEOUS | 5 refills | Status: AC
  Filled 2023-01-26: qty 90, 30d supply, fill #0

## 2023-01-26 MED ORDER — SPIRONOLACTONE 50 MG PO TABS
50.0000 mg | ORAL_TABLET | Freq: Two times a day (BID) | ORAL | 5 refills | Status: AC
  Filled 2023-01-26: qty 60, 30d supply, fill #0
  Filled 2023-02-24: qty 60, 30d supply, fill #1
  Filled 2023-03-29: qty 60, 30d supply, fill #2
  Filled 2023-04-29: qty 60, 30d supply, fill #3
  Filled 2023-08-10: qty 60, 30d supply, fill #4
  Filled 2023-09-15: qty 60, 30d supply, fill #5

## 2023-01-27 ENCOUNTER — Other Ambulatory Visit (HOSPITAL_BASED_OUTPATIENT_CLINIC_OR_DEPARTMENT_OTHER): Payer: Self-pay

## 2023-01-28 ENCOUNTER — Other Ambulatory Visit (HOSPITAL_BASED_OUTPATIENT_CLINIC_OR_DEPARTMENT_OTHER): Payer: Self-pay

## 2023-04-29 ENCOUNTER — Other Ambulatory Visit (HOSPITAL_BASED_OUTPATIENT_CLINIC_OR_DEPARTMENT_OTHER): Payer: Self-pay

## 2023-04-29 MED ORDER — SPIRONOLACTONE 100 MG PO TABS
100.0000 mg | ORAL_TABLET | Freq: Two times a day (BID) | ORAL | 2 refills | Status: DC
  Filled 2023-04-29: qty 60, 30d supply, fill #0
  Filled 2023-06-14: qty 60, 30d supply, fill #1
  Filled 2023-07-13: qty 60, 30d supply, fill #2

## 2023-04-29 MED ORDER — CICLOPIROX OLAMINE 0.77 % EX CREA
1.0000 | TOPICAL_CREAM | Freq: Every day | CUTANEOUS | 5 refills | Status: AC
  Filled 2023-04-29: qty 90, 30d supply, fill #0

## 2023-04-30 ENCOUNTER — Other Ambulatory Visit (HOSPITAL_BASED_OUTPATIENT_CLINIC_OR_DEPARTMENT_OTHER): Payer: Self-pay

## 2023-05-12 ENCOUNTER — Other Ambulatory Visit (HOSPITAL_BASED_OUTPATIENT_CLINIC_OR_DEPARTMENT_OTHER): Payer: Self-pay

## 2023-06-15 ENCOUNTER — Other Ambulatory Visit (HOSPITAL_BASED_OUTPATIENT_CLINIC_OR_DEPARTMENT_OTHER): Payer: Self-pay

## 2023-07-27 ENCOUNTER — Other Ambulatory Visit (HOSPITAL_BASED_OUTPATIENT_CLINIC_OR_DEPARTMENT_OTHER): Payer: Self-pay

## 2023-07-27 MED ORDER — TRIAMCINOLONE ACETONIDE 0.1 % EX OINT
1.0000 | TOPICAL_OINTMENT | Freq: Two times a day (BID) | CUTANEOUS | 0 refills | Status: AC
  Filled 2023-07-27: qty 30, 15d supply, fill #0

## 2023-07-27 MED ORDER — VALACYCLOVIR HCL 1 G PO TABS
1.0000 g | ORAL_TABLET | Freq: Three times a day (TID) | ORAL | 0 refills | Status: AC
  Filled 2023-07-27: qty 21, 7d supply, fill #0

## 2023-07-28 ENCOUNTER — Other Ambulatory Visit (HOSPITAL_BASED_OUTPATIENT_CLINIC_OR_DEPARTMENT_OTHER): Payer: Self-pay

## 2023-07-28 MED ORDER — TRETINOIN 0.025 % EX CREA
1.0000 | TOPICAL_CREAM | Freq: Every day | CUTANEOUS | 5 refills | Status: AC
  Filled 2023-07-28: qty 45, 30d supply, fill #0

## 2023-07-28 MED ORDER — CICLOPIROX OLAMINE 0.77 % EX CREA
1.0000 | TOPICAL_CREAM | Freq: Every day | CUTANEOUS | 5 refills | Status: AC
  Filled 2023-07-28: qty 30, 30d supply, fill #0

## 2023-07-28 MED ORDER — SPIRONOLACTONE 100 MG PO TABS
100.0000 mg | ORAL_TABLET | Freq: Two times a day (BID) | ORAL | 2 refills | Status: DC
  Filled 2023-07-28 – 2023-10-14 (×2): qty 60, 30d supply, fill #0
  Filled 2023-12-16: qty 60, 30d supply, fill #1
  Filled 2024-02-11: qty 60, 30d supply, fill #2

## 2023-10-14 ENCOUNTER — Other Ambulatory Visit (HOSPITAL_BASED_OUTPATIENT_CLINIC_OR_DEPARTMENT_OTHER): Payer: Self-pay

## 2024-04-05 ENCOUNTER — Other Ambulatory Visit (HOSPITAL_BASED_OUTPATIENT_CLINIC_OR_DEPARTMENT_OTHER): Payer: Self-pay

## 2024-04-06 ENCOUNTER — Other Ambulatory Visit (HOSPITAL_BASED_OUTPATIENT_CLINIC_OR_DEPARTMENT_OTHER): Payer: Self-pay

## 2024-04-06 MED ORDER — SPIRONOLACTONE 100 MG PO TABS
100.0000 mg | ORAL_TABLET | Freq: Two times a day (BID) | ORAL | 4 refills | Status: AC
  Filled 2024-04-06: qty 60, 30d supply, fill #0
